# Patient Record
Sex: Female | Born: 1955 | Race: White | Hispanic: No | Smoking: Current every day smoker
Health system: Southern US, Community
[De-identification: ages and names within clinical notes are randomized; demographics above are authoritative.]

## PROBLEM LIST (undated history)

## (undated) DIAGNOSIS — G459 Transient cerebral ischemic attack, unspecified: Secondary | ICD-10-CM

## (undated) DIAGNOSIS — I10 Essential (primary) hypertension: Secondary | ICD-10-CM

## (undated) DIAGNOSIS — J45909 Unspecified asthma, uncomplicated: Secondary | ICD-10-CM

## (undated) HISTORY — PX: ABDOMINAL HYSTERECTOMY: SHX81

---

## 2016-08-26 ENCOUNTER — Encounter: Payer: Self-pay | Admitting: Emergency Medicine

## 2016-08-26 ENCOUNTER — Inpatient Hospital Stay
Admission: EM | Admit: 2016-08-26 | Discharge: 2016-08-27 | DRG: 190 | Disposition: A | Discharge status: Home / Self Care | Attending: Internal Medicine | Admitting: Internal Medicine

## 2016-08-26 ENCOUNTER — Emergency Department

## 2016-08-26 DIAGNOSIS — Z7982 Long term (current) use of aspirin: Secondary | ICD-10-CM

## 2016-08-26 DIAGNOSIS — F17200 Nicotine dependence, unspecified, uncomplicated: Secondary | ICD-10-CM | POA: Diagnosis present

## 2016-08-26 DIAGNOSIS — J441 Chronic obstructive pulmonary disease with (acute) exacerbation: Principal | ICD-10-CM | POA: Diagnosis present

## 2016-08-26 DIAGNOSIS — Z79899 Other long term (current) drug therapy: Secondary | ICD-10-CM

## 2016-08-26 DIAGNOSIS — Z8673 Personal history of transient ischemic attack (TIA), and cerebral infarction without residual deficits: Secondary | ICD-10-CM

## 2016-08-26 DIAGNOSIS — Z8249 Family history of ischemic heart disease and other diseases of the circulatory system: Secondary | ICD-10-CM | POA: Diagnosis not present

## 2016-08-26 DIAGNOSIS — J9601 Acute respiratory failure with hypoxia: Secondary | ICD-10-CM | POA: Diagnosis present

## 2016-08-26 DIAGNOSIS — R0602 Shortness of breath: Secondary | ICD-10-CM | POA: Diagnosis present

## 2016-08-26 DIAGNOSIS — I10 Essential (primary) hypertension: Secondary | ICD-10-CM | POA: Diagnosis present

## 2016-08-26 HISTORY — DX: Essential (primary) hypertension: I10

## 2016-08-26 HISTORY — DX: Unspecified asthma, uncomplicated: J45.909

## 2016-08-26 HISTORY — DX: Transient cerebral ischemic attack, unspecified: G45.9

## 2016-08-26 LAB — CBC
HEMATOCRIT: 47.2 % — AB (ref 35.0–47.0)
HEMOGLOBIN: 16.7 g/dL — AB (ref 12.0–16.0)
MCH: 31.5 pg (ref 26.0–34.0)
MCHC: 35.4 g/dL (ref 32.0–36.0)
MCV: 89 fL (ref 80.0–100.0)
Platelets: 323 10*3/uL (ref 150–440)
RBC: 5.3 MIL/uL — AB (ref 3.80–5.20)
RDW: 13.3 % (ref 11.5–14.5)
WBC: 11.7 10*3/uL — ABNORMAL HIGH (ref 3.6–11.0)

## 2016-08-26 LAB — BASIC METABOLIC PANEL
ANION GAP: 6 (ref 5–15)
BUN: 13 mg/dL (ref 6–20)
CALCIUM: 9.5 mg/dL (ref 8.9–10.3)
CO2: 37 mmol/L — ABNORMAL HIGH (ref 22–32)
Chloride: 93 mmol/L — ABNORMAL LOW (ref 101–111)
Creatinine, Ser: 1.01 mg/dL — ABNORMAL HIGH (ref 0.44–1.00)
GFR, EST NON AFRICAN AMERICAN: 59 mL/min — AB (ref >60)
GLUCOSE: 154 mg/dL — AB (ref 65–99)
POTASSIUM: 3.5 mmol/L (ref 3.5–5.1)
Sodium: 136 mmol/L (ref 135–145)

## 2016-08-26 LAB — TROPONIN I

## 2016-08-26 MED ORDER — METHYLPREDNISOLONE SODIUM SUCC 125 MG IJ SOLR
125.0000 mg | Freq: Four times a day (QID) | INTRAMUSCULAR | Status: DC
  Administered 2016-08-27 (×3): 125 mg via INTRAVENOUS
  Filled 2016-08-26 (×3): qty 2

## 2016-08-26 MED ORDER — IPRATROPIUM-ALBUTEROL 0.5-2.5 (3) MG/3ML IN SOLN
3.0000 mL | Freq: Once | RESPIRATORY_TRACT | Status: AC
  Administered 2016-08-26: 3 mL via RESPIRATORY_TRACT
  Filled 2016-08-26: qty 3

## 2016-08-26 MED ORDER — IPRATROPIUM-ALBUTEROL 0.5-2.5 (3) MG/3ML IN SOLN
3.0000 mL | Freq: Four times a day (QID) | RESPIRATORY_TRACT | Status: DC
  Administered 2016-08-27 (×2): 3 mL via RESPIRATORY_TRACT
  Filled 2016-08-26 (×3): qty 3

## 2016-08-26 MED ORDER — SODIUM CHLORIDE 0.9 % IV BOLUS (SEPSIS)
500.0000 mL | Freq: Once | INTRAVENOUS | Status: AC
  Administered 2016-08-26: 500 mL via INTRAVENOUS

## 2016-08-26 MED ORDER — LEVOFLOXACIN IN D5W 750 MG/150ML IV SOLN
750.0000 mg | Freq: Once | INTRAVENOUS | Status: AC
  Administered 2016-08-27: 750 mg via INTRAVENOUS
  Filled 2016-08-26: qty 150

## 2016-08-26 MED ORDER — ALBUTEROL SULFATE (2.5 MG/3ML) 0.083% IN NEBU
10.0000 mg | INHALATION_SOLUTION | Freq: Once | RESPIRATORY_TRACT | Status: AC
  Administered 2016-08-26: 10 mg via RESPIRATORY_TRACT

## 2016-08-26 MED ORDER — ALBUTEROL SULFATE (2.5 MG/3ML) 0.083% IN NEBU
INHALATION_SOLUTION | RESPIRATORY_TRACT | Status: AC
  Administered 2016-08-26: 10 mg via RESPIRATORY_TRACT
  Filled 2016-08-26: qty 12

## 2016-08-26 MED ORDER — DOXYCYCLINE HYCLATE 100 MG PO TABS
100.0000 mg | ORAL_TABLET | Freq: Once | ORAL | Status: AC
  Administered 2016-08-26: 100 mg via ORAL
  Filled 2016-08-26: qty 1

## 2016-08-26 MED ORDER — METHYLPREDNISOLONE SODIUM SUCC 125 MG IJ SOLR
125.0000 mg | Freq: Once | INTRAMUSCULAR | Status: AC
  Administered 2016-08-26: 125 mg via INTRAVENOUS
  Filled 2016-08-26: qty 2

## 2016-08-26 NOTE — ED Notes (Signed)
Pt ambulated around ed - denies sob but sp02 dropped to 89% and hr 100

## 2016-08-26 NOTE — ED Triage Notes (Addendum)
[facility] given [facility] tx. Pt reports still having SHOB. Denies fevers. Yellow productive cough. Worse on exertion. No COPD per pt, no home O2

## 2016-08-26 NOTE — ED Notes (Signed)
Pt states was seen last week at er after having (cold symptoms) x 1 week. States they offered to put her on preventative antibiotic and she declined. States not feeling better.

## 2016-08-26 NOTE — Progress Notes (Signed)
Pharmacy Antibiotic Note  Cipriano BunkerDeborah Fifield is a 60 y.o. female admitted on 08/26/2016 with AECOPD.  Pharmacy has been consulted for levofloxacin dosing.  Plan: Levofloxacin 750 mg IV Q48H  Height: 5\' 2"  (157.5 cm) Weight: 126 lb (57.2 kg) IBW/kg (Calculated) : 50.1  Temp (24hrs), Avg:98 F (36.7 C), Min:98 F (36.7 C), Max:98 F (36.7 C)   Recent Labs Lab 08/26/16 1733  WBC 11.7*  CREATININE 1.01*    Estimated Creatinine Clearance: 46.3 mL/min (by C-G formula based on SCr of 1.01 mg/dL (H)).    Allergies  Allergen Reactions  . Biaxin [Clarithromycin] Anaphylaxis  . Citracal [Calcium Citrate] Anaphylaxis  . Penicillins Rash and Other (See Comments)    Syncope Has patient had a PCN reaction causing immediate rash, facial/tongue/throat swelling, SOB or lightheadedness with hypotension: No Has patient had a PCN reaction causing severe rash involving mucus membranes or skin necrosis: No Has patient had a PCN reaction that required hospitalization No Has patient had a PCN reaction occurring within the last 10 years: No If all of the above answers are "NO", then may proceed with Cephalosporin use.   Doylene Bode. Septra [Sulfamethoxazole-Trimethoprim] Anaphylaxis  . Sudafed [Pseudoephedrine Hcl] Anaphylaxis  . Other     Pt reports allergy to one other abx but does not remember what    Thank you for allowing pharmacy to be a part of this patient's care.  Carola FrostNathan A Michelena Culmer, Pharm.D., BCPS Clinical Pharmacist 08/26/2016 11:49 PM

## 2016-08-26 NOTE — H&P (Signed)
[facility] -  at [facility]   PATIENT NAME: Gloria Beck    MR#:  [id number]  DATE OF BIRTH:  06/15/1956  DATE OF ADMISSION:  08/26/2016  PRIMARY CARE PHYSICIAN: No primary care provider on file.   REQUESTING/REFERRING PHYSICIAN:   CHIEF COMPLAINT:   Chief Complaint  Patient presents with  . Shortness of Breath    HISTORY OF PRESENT ILLNESS: Gloria Beck  is a 60 y.o. female with a known history of COPD, hypertension, transient ischemic attack presented to the emergency room with increased shortness of breath since one week. Patient moved from [place] to [place] week ago. Patient was seen at [facility] for similar complaints. She has shortness of breath and wheeze for the last 1 week and also has cough which is dry in nature. No history of any fever or chills. Chest x-ray showed COPD changes during the workup but no evidence of pneumonia. Patient was given IV Solu-Medrol and breathing treatments in the emergency room and hospitalist service was consulted for further care. No complaints of any chest pain. No history of fever or chills.  PAST MEDICAL HISTORY:   Past Medical History:  Diagnosis Date  . Asthma   . Hypertension   . TIA (transient ischemic attack)     PAST SURGICAL HISTORY: Past Surgical History:  Procedure Laterality Date  . ABDOMINAL HYSTERECTOMY      SOCIAL HISTORY:  Social History  Substance Use Topics  . Smoking status: Current Every Day Smoker  . Smokeless tobacco: Never Used  . Alcohol use No    FAMILY HISTORY:  Family History  Problem Relation Age of Onset  . Hypertension Mother     DRUG ALLERGIES:  Allergies  Allergen Reactions  . Biaxin [Clarithromycin] Anaphylaxis  . Citracal [Calcium Citrate] Anaphylaxis  . Penicillins Rash and Other (See Comments)    Syncope Has patient had a PCN reaction causing immediate rash, facial/tongue/throat swelling, SOB or lightheadedness with  hypotension: No Has patient had a PCN reaction causing severe rash involving mucus membranes or skin necrosis: No Has patient had a PCN reaction that required hospitalization No Has patient had a PCN reaction occurring within the last 10 years: No If all of the above answers are "NO", then may proceed with Cephalosporin use.   Doylene Bode [Sulfamethoxazole-Trimethoprim] Anaphylaxis  . Sudafed [Pseudoephedrine Hcl] Anaphylaxis  . Other     Pt reports allergy to one other abx but does not remember what    REVIEW OF SYSTEMS:   CONSTITUTIONAL: No fever, fatigue or weakness.  EYES: No blurred or double vision.  EARS, NOSE, AND THROAT: No tinnitus or ear pain.  RESPIRATORY: Has cough, shortness of breath, wheezing  no hemoptysis.  CARDIOVASCULAR: No chest pain, orthopnea, edema.  GASTROINTESTINAL: No nausea, vomiting, diarrhea or abdominal pain.  GENITOURINARY: No dysuria, hematuria.  ENDOCRINE: No polyuria, nocturia,  HEMATOLOGY: No anemia, easy bruising or bleeding SKIN: No rash or lesion. MUSCULOSKELETAL: No joint pain or arthritis.   NEUROLOGIC: No tingling, numbness, weakness.  PSYCHIATRY: No anxiety or depression.   MEDICATIONS AT HOME:  Prior to Admission medications   Medication Sig Start Date End Date Taking? Authorizing Provider  albuterol (PROVENTIL HFA;VENTOLIN HFA) 108 (90 Base) MCG/ACT inhaler Inhale 2 puffs into the lungs every 6 (six) hours as needed for wheezing or shortness of breath.  06/25/16 06/25/17 Yes Historical Provider, MD  aspirin EC 81 MG tablet Take 81 mg by mouth at bedtime.   Yes Historical Provider, MD  busPIRone (BUSPAR) 5 MG tablet Take 5 mg by mouth 2 (two) times daily. 02/22/16  Yes Historical Provider, MD  carvedilol (COREG) 25 MG tablet Take 25 mg by mouth 2 (two) times daily. 02/22/16  Yes Historical Provider, MD  clonazePAM (KLONOPIN) 1 MG tablet Take 2 mg by mouth 2 (two) times daily. 08/09/16  Yes Historical Provider, MD  DULoxetine (CYMBALTA) 60 MG  capsule Take 1 capsule by mouth daily. 07/03/16  Yes Historical Provider, MD  Fluticasone-Salmeterol (ADVAIR DISKUS) 100-50 MCG/DOSE AEPB Inhale 1 puff into the lungs 2 (two) times daily. 07/03/16  Yes Historical Provider, MD  omeprazole (PRILOSEC) 20 MG capsule Take 20 mg by mouth daily. 02/22/16  Yes Historical Provider, MD  potassium chloride SA (K-DUR,KLOR-CON) 20 MEQ tablet Take 20 mEq by mouth 2 (two) times daily. 07/03/16 07/03/17 Yes Historical Provider, MD  tiotropium (SPIRIVA) 18 MCG inhalation capsule Place 1 capsule into inhaler and inhale daily. 07/03/16  Yes Historical Provider, MD  triamterene-hydrochlorothiazide (DYAZIDE) 37.5-25 MG capsule Take 1 capsule by mouth daily. 07/03/16  Yes Historical Provider, MD      PHYSICAL EXAMINATION:   VITAL SIGNS: Blood pressure (!) 160/95, pulse (!) 104, temperature 98 F (36.7 C), temperature source Oral, resp. rate 18, height 5\' 2"  (1.575 m), weight 57.2 kg (126 lb), SpO2 (!) 87 %.  GENERAL:  60 y.o.-year-old patient lying in the bed with no acute distress.  EYES: Pupils equal, round, reactive to light and accommodation. No scleral icterus. Extraocular muscles intact.  HEENT: Head atraumatic, normocephalic. Oropharynx and nasopharynx clear.  NECK:  Supple, no jugular venous distention. No thyroid enlargement, no tenderness.  LUNGS: Decreased breath sounds bilaterally, has wheezing in both lungs, rales. No use of accessory muscles of respiration.  CARDIOVASCULAR: S1, S2 normal. No murmurs, rubs, or gallops.  ABDOMEN: Soft, nontender, nondistended. Bowel sounds present. No organomegaly or mass.  EXTREMITIES: No pedal edema, cyanosis, or clubbing.  NEUROLOGIC: Cranial nerves II through XII are intact. Muscle strength 5/5 in all extremities. Sensation intact. Gait not checked.  PSYCHIATRIC: The patient is alert and oriented x 3.  SKIN: No obvious rash, lesion, or ulcer.   LABORATORY PANEL:   CBC  Recent Labs Lab 08/26/16 1733  WBC 11.7*   HGB 16.7*  HCT 47.2*  PLT 323  MCV 89.0  MCH 31.5  MCHC 35.4  RDW 13.3   ------------------------------------------------------------------------------------------------------------------  Chemistries   Recent Labs Lab 08/26/16 1733  NA 136  K 3.5  CL 93*  CO2 37*  GLUCOSE 154*  BUN 13  CREATININE 1.01*  CALCIUM 9.5   ------------------------------------------------------------------------------------------------------------------ estimated creatinine clearance is 46.3 mL/min (by C-G formula based on SCr of 1.01 mg/dL (H)). ------------------------------------------------------------------------------------------------------------------ No results for input(s): TSH, T4TOTAL, T3FREE, THYROIDAB in the last 72 hours.  Invalid input(s): FREET3   Coagulation profile No results for input(s): INR, PROTIME in the last 168 hours. ------------------------------------------------------------------------------------------------------------------- No results for input(s): DDIMER in the last 72 hours. -------------------------------------------------------------------------------------------------------------------  Cardiac Enzymes  Recent Labs Lab 08/26/16 1733 08/26/16 2013  TROPONINI <0.03 <0.03   ------------------------------------------------------------------------------------------------------------------ Invalid input(s): POCBNP  ---------------------------------------------------------------------------------------------------------------  Urinalysis No results found for: COLORURINE, APPEARANCEUR, LABSPEC, PHURINE, GLUCOSEU, HGBUR, BILIRUBINUR, KETONESUR, PROTEINUR, UROBILINOGEN, NITRITE, LEUKOCYTESUR   RADIOLOGY: Dg Chest 2 View  Result Date: 08/26/2016 CLINICAL DATA:  Shortness of breath for 2 weeks. EXAM: CHEST  2 VIEW COMPARISON:  None. FINDINGS: The cardiomediastinal silhouette is unremarkable. COPD/emphysema changes noted. Bibasilar atelectasis/ scarring  noted. There is no evidence of focal airspace disease, pulmonary edema, suspicious pulmonary nodule/mass,  pleural effusion, or pneumothorax. No acute bony abnormalities are identified. IMPRESSION: COPD/ emphysema with mild bibasilar atelectasis versus scarring. Electronically Signed   By: Harmon PierJeffrey  Hu M.D.   On: 08/26/2016 17:58    EKG: Orders placed or performed during the hospital encounter of 08/26/16  . EKG 12-Lead  . EKG 12-Lead  . ED EKG  . ED EKG  . ED EKG  . ED EKG  . EKG 12-Lead  . EKG 12-Lead    IMPRESSION AND PLAN: 60 year old female patient with history of COPD, TIA and hypertension presented to the emergency room with increased shortness of breath and cough. Admitting diagnosis 1. Acute COPD exacerbation 2. Dyspnea secondary to COPD exacerbation 3. Hypertension Treatment plan Admit patient to medical floor IV Solu-Medrol 125 MG every 6 hourly Breathing treatments around the clock Start patient on IV Levaquin antibiotic DVT prophylaxis subcutaneous Lovenox 40 MG daily Supportive care Smoking cessation counseled to the patient.  All the records are reviewed and case discussed with ED provider. Management plans discussed with the patient, family and they are in agreement.  CODE STATUS:FULL Code Status History    This patient does not have a recorded code status. Please follow your organizational policy for patients in this situation.       TOTAL TIME TAKING CARE OF THIS PATIENT: 50 minutes.    Ihor AustinPavan Amanie Mcculley M.D on 08/26/2016 at 11:42 PM  Between 7am to 6pm - Pager - [phone]  After 6pm go to [email or web address] - password [email or web address] [facility]  [facility]  Office  [phone]  CC: Primary care physician; No primary care provider on file.

## 2016-08-26 NOTE — ED Provider Notes (Addendum)
[facility] Emergency Department Provider Note  ____________________________________________  Time seen: Approximately 6:22 PM  I have reviewed the triage vital signs and the nursing notes.   HISTORY  Chief Complaint Shortness of Breath   HPI Gloria Beck is a 60 y.o. female with a history of asthma, COPD, current smoker, hypertension, and TIA who presents for evaluation of shortness of breath. Patient reports 3 weeks of wheezing, cough productive of light yellow sputum and shortness of breath that is worse with exertion. She was seen at [facility] a week ago and received a 1-1 and treatment with improvement of her symptoms. Patient was not started on antibiotics or steroids. She reports that she felt better for a few days but then it got progressively worse. Her inhalers are not no longer helping at home. Patient reports that she is on Spiriva and Advair at home. Patient denies chest pain, orthopnea, leg pain or swelling, personal or family history of ischemic heart disease, blood clots, recent travel or immobilization, history of exogenous hormones or hemoptysis. Patient is not on oxygen at home. No fever, no chills, no nausea, no vomiting.  Past Medical History:  Diagnosis Date  . Asthma   . Hypertension   . TIA (transient ischemic attack)     There are no active problems to display for this patient.   Past Surgical History:  Procedure Laterality Date  . ABDOMINAL HYSTERECTOMY      Prior to Admission medications   Not on File    Allergies Biaxin [clarithromycin]; Citracal [calcium citrate]; Septra [sulfamethoxazole-trimethoprim]; Sudafed [pseudoephedrine hcl]; Other; and Penicillins  History reviewed. No pertinent family history.  Social History Social History  Substance Use Topics  . Smoking status: Current Every Day Smoker  . Smokeless tobacco: Not on file  . Alcohol use No    Review of Systems  Constitutional: Negative for fever. Eyes:  Negative for visual changes. ENT: Negative for sore throat. Cardiovascular: Negative for chest pain. Respiratory: + shortness of breath, wheezing and cough Gastrointestinal: Negative for abdominal pain, vomiting or diarrhea. Genitourinary: Negative for dysuria. Musculoskeletal: Negative for back pain. Skin: Negative for rash. Neurological: Negative for headaches, weakness or numbness.  ____________________________________________   PHYSICAL EXAM:  VITAL SIGNS: ED Triage Vitals  Enc Vitals Group     BP 08/26/16 1734 (!) 127/98     Pulse Rate 08/26/16 1734 (!) 108     Resp 08/26/16 1734 (!) 26     Temp 08/26/16 1734 98 F (36.7 C)     Temp Source 08/26/16 1734 Oral     SpO2 08/26/16 1734 (!) 88 %     Weight 08/26/16 1729 126 lb (57.2 kg)     Height 08/26/16 1729 5\' 2"  (1.575 m)     Head Circumference --      Peak Flow --      Pain Score --      Pain Loc --      Pain Edu? --      Excl. in GC? --     Constitutional: Alert and oriented. Well appearing and in no apparent distress. HEENT:      Head: Normocephalic and atraumatic.         Eyes: Conjunctivae are normal. Sclera is non-icteric. EOMI. PERRL      Mouth/Throat: Mucous membranes are moist.       Neck: Supple with no signs of meningismus. Cardiovascular: Regular rate and rhythm. No murmurs, gallops, or rubs. 2+ symmetrical distal pulses are present in all  extremities. No JVD. Respiratory: Tachypnea with mildly increased work of breathing. Sating 93% on RA, severely diminished air movement bilaterally with no crackles or wheezes  Gastrointestinal: Soft, non tender, and non distended with positive bowel sounds. No rebound or guarding. Musculoskeletal: Nontender with normal range of motion in all extremities. No edema, cyanosis, or erythema of extremities. Neurologic: Normal speech and language. Face is symmetric. Moving all extremities. No gross focal neurologic deficits are appreciated. Skin: Skin is warm, dry and intact.  No rash noted. Psychiatric: Mood and affect are normal. Speech and behavior are normal.  ____________________________________________   LABS (all labs ordered are listed, but only abnormal results are displayed)  Labs Reviewed  BASIC METABOLIC PANEL - Abnormal; Notable for the following:       Result Value   Chloride 93 (*)    CO2 37 (*)    Glucose, Bld 154 (*)    Creatinine, Ser 1.01 (*)    GFR calc non Af Amer 59 (*)    All other components within normal limits  CBC - Abnormal; Notable for the following:    WBC 11.7 (*)    RBC 5.30 (*)    Hemoglobin 16.7 (*)    HCT 47.2 (*)    All other components within normal limits  TROPONIN I  TROPONIN I   ____________________________________________  EKG  ED ECG REPORT I, Nita Sicklearolina Annastyn Silvey, the attending physician, personally viewed and interpreted this ECG.  Sinus tachycardia, rate of 110, normal intervals, normal axis, slight ST depressions on inferior leads, no STE, no old for comparison.  21:59 - Normal sinus rhythm, rate of 88, normal intervals, normal axis, slight ST depressions in inferior leads which are unchanged from prior. No ST elevations. ____________________________________________  RADIOLOGY  CXR:  COPD/ emphysema with mild bibasilar atelectasis versus scarring ____________________________________________   PROCEDURES  Procedure(s) performed: None Procedures Critical Care performed: yes  CRITICAL CARE Performed by: Nita Sicklearolina Kathaleen Dudziak  ?  Total critical care time: 35 min  Critical care time was exclusive of separately billable procedures and treating other patients.  Critical care was necessary to treat or prevent imminent or life-threatening deterioration.  Critical care was time spent personally by me on the following activities: development of treatment plan with patient and/or surrogate as well as nursing, discussions with consultants, evaluation of patient's response to treatment, examination of  patient, obtaining history from patient or surrogate, ordering and performing treatments and interventions, ordering and review of laboratory studies, ordering and review of radiographic studies, pulse oximetry and re-evaluation of patient's condition. 35 ____________________________________________   INITIAL IMPRESSION / ASSESSMENT AND PLAN / ED COURSE  60 y.o. female with a history of asthma, COPD, current smoker, hypertension, and TIA who presents for evaluation of shortness of breath, wheezing and cough productive of yellow sputum. Patient was found to be hypoxic on triage and placed on 2 L nasal cannula. During my examination I turned off the oxygen and patient was satting 93%. She has slightly increased work of breathing, severely diminished air movement bilaterally concerning for COPD/asthma exacerbation. Will start patient on DuoNeb 3, Solu-Medrol, and doxycycline (patient has anaphylaxis to azithromycin). EKG has slight ST depressions on inferior leads with no ST elevation. No old for comparison. The first troponin is negative. Possibly a slight component of demand ischemia in the setting of tachycardia. We'll recheck a second troponin and EKG in 3 hours. Patient has no chest pain.  Clinical Course  Comment By Time  Patient received a DuoNeb 3 and has faint  expiratory wheezes but continues to have really decreased air movement. I attempted to ambulate patient and she decided to 86%. Patient wishes to go home therefore we'll give her 10 mg of albuterol and reassess at that time. Nita Sickle, MD 10/15 2012  Patient received 10 mg of albuterol and continued to drop her sats to the upper 80s with minimal ambulation in the ED. We'll consult the hospitalist for admission. Nita Sickle, MD 10/15 2124    Pertinent labs & imaging results that were available during my care of the patient were reviewed by me and considered in my medical decision making (see chart for  details).    ____________________________________________   FINAL CLINICAL IMPRESSION(S) / ED DIAGNOSES  Final diagnoses:  Acute respiratory failure with hypoxia (HCC)  COPD exacerbation (HCC)      NEW MEDICATIONS STARTED DURING THIS VISIT:  New Prescriptions   No medications on file     Note:  This document was prepared using Dragon voice recognition software and may include unintentional dictation errors.    Nita Sickle, MD 08/26/16 2126    Nita Sickle, MD 08/26/16 2202

## 2016-08-27 LAB — BASIC METABOLIC PANEL
ANION GAP: 7 (ref 5–15)
BUN: 13 mg/dL (ref 6–20)
CHLORIDE: 97 mmol/L — AB (ref 101–111)
CO2: 31 mmol/L (ref 22–32)
Calcium: 9.2 mg/dL (ref 8.9–10.3)
Creatinine, Ser: 0.83 mg/dL (ref 0.44–1.00)
GFR calc non Af Amer: >60 mL/min (ref >60)
Glucose, Bld: 166 mg/dL — ABNORMAL HIGH (ref 65–99)
POTASSIUM: 3.1 mmol/L — AB (ref 3.5–5.1)
SODIUM: 135 mmol/L (ref 135–145)

## 2016-08-27 LAB — CBC
HEMATOCRIT: 43.7 % (ref 35.0–47.0)
HEMOGLOBIN: 15.1 g/dL (ref 12.0–16.0)
MCH: 31.1 pg (ref 26.0–34.0)
MCHC: 34.5 g/dL (ref 32.0–36.0)
MCV: 90.1 fL (ref 80.0–100.0)
PLATELETS: 277 10*3/uL (ref 150–440)
RBC: 4.85 MIL/uL (ref 3.80–5.20)
RDW: 13.4 % (ref 11.5–14.5)
WBC: 6.6 10*3/uL (ref 3.6–11.0)

## 2016-08-27 LAB — PROCALCITONIN: Procalcitonin: <0.1 ng/mL

## 2016-08-27 MED ORDER — DULOXETINE HCL 60 MG PO CPEP
60.0000 mg | ORAL_CAPSULE | Freq: Every day | ORAL | Status: DC
  Administered 2016-08-27: 60 mg via ORAL
  Filled 2016-08-27: qty 1

## 2016-08-27 MED ORDER — PANTOPRAZOLE SODIUM 40 MG PO TBEC
40.0000 mg | DELAYED_RELEASE_TABLET | Freq: Every day | ORAL | Status: DC
  Administered 2016-08-27: 40 mg via ORAL
  Filled 2016-08-27: qty 1

## 2016-08-27 MED ORDER — HYDROCODONE-ACETAMINOPHEN 5-325 MG PO TABS
1.0000 | ORAL_TABLET | ORAL | Status: DC | PRN
Start: 2016-08-27 — End: 2016-08-27

## 2016-08-27 MED ORDER — ACETAMINOPHEN 650 MG RE SUPP: 650.0000 mg | Freq: Four times a day (QID) | RECTAL | Status: DC | PRN

## 2016-08-27 MED ORDER — TRIAMTERENE-HCTZ 37.5-25 MG PO TABS
1.0000 | ORAL_TABLET | Freq: Every day | ORAL | Status: DC
  Administered 2016-08-27: 1 via ORAL
  Filled 2016-08-27 (×2): qty 1

## 2016-08-27 MED ORDER — LEVOFLOXACIN IN D5W 750 MG/150ML IV SOLN: 750.0000 mg | INTRAVENOUS | Status: DC

## 2016-08-27 MED ORDER — SODIUM CHLORIDE 0.9% FLUSH
3.0000 mL | Freq: Two times a day (BID) | INTRAVENOUS | Status: DC
  Administered 2016-08-27 (×2): 3 mL via INTRAVENOUS

## 2016-08-27 MED ORDER — LEVOFLOXACIN 750 MG PO TABS: 750.0000 mg | ORAL_TABLET | Freq: Every day | ORAL | 0 refills | Status: DC

## 2016-08-27 MED ORDER — ACETAMINOPHEN 325 MG PO TABS: 650.0000 mg | ORAL_TABLET | Freq: Four times a day (QID) | ORAL | Status: DC | PRN

## 2016-08-27 MED ORDER — ONDANSETRON HCL 4 MG PO TABS: 4.0000 mg | ORAL_TABLET | Freq: Four times a day (QID) | ORAL | Status: DC | PRN

## 2016-08-27 MED ORDER — SODIUM CHLORIDE 0.9% FLUSH: 3.0000 mL | INTRAVENOUS | Status: DC | PRN

## 2016-08-27 MED ORDER — PREDNISONE 50 MG PO TABS: 50.0000 mg | ORAL_TABLET | Freq: Every day | ORAL | Status: DC

## 2016-08-27 MED ORDER — TIOTROPIUM BROMIDE MONOHYDRATE 18 MCG IN CAPS
1.0000 | ORAL_CAPSULE | Freq: Every day | RESPIRATORY_TRACT | Status: DC
  Administered 2016-08-27: 09:00:00 18 ug via RESPIRATORY_TRACT
  Filled 2016-08-27: qty 5

## 2016-08-27 MED ORDER — PREDNISONE 50 MG PO TABS: ORAL_TABLET | ORAL | 0 refills | Status: AC

## 2016-08-27 MED ORDER — POTASSIUM CHLORIDE CRYS ER 20 MEQ PO TBCR
20.0000 meq | EXTENDED_RELEASE_TABLET | Freq: Two times a day (BID) | ORAL | Status: DC
  Administered 2016-08-27 (×2): 20 meq via ORAL
  Filled 2016-08-27 (×2): qty 1

## 2016-08-27 MED ORDER — BUSPIRONE HCL 5 MG PO TABS
5.0000 mg | ORAL_TABLET | Freq: Two times a day (BID) | ORAL | Status: DC
  Administered 2016-08-27 (×2): 5 mg via ORAL
  Filled 2016-08-27 (×2): qty 1

## 2016-08-27 MED ORDER — ASPIRIN EC 81 MG PO TBEC
81.0000 mg | DELAYED_RELEASE_TABLET | Freq: Every day | ORAL | Status: DC
  Administered 2016-08-27: 02:00:00 81 mg via ORAL
  Filled 2016-08-27: qty 1

## 2016-08-27 MED ORDER — SODIUM CHLORIDE 0.9 % IV SOLN: 250.0000 mL | INTRAVENOUS | Status: DC | PRN

## 2016-08-27 MED ORDER — ONDANSETRON HCL 4 MG/2ML IJ SOLN: 4.0000 mg | Freq: Four times a day (QID) | INTRAMUSCULAR | Status: DC | PRN

## 2016-08-27 MED ORDER — IPRATROPIUM-ALBUTEROL 0.5-2.5 (3) MG/3ML IN SOLN: 3.0000 mL | Freq: Four times a day (QID) | RESPIRATORY_TRACT | 1 refills | Status: AC

## 2016-08-27 MED ORDER — LEVOFLOXACIN 750 MG PO TABS: 750.0000 mg | ORAL_TABLET | Freq: Every day | ORAL | Status: DC

## 2016-08-27 MED ORDER — CLONAZEPAM 1 MG PO TABS
2.0000 mg | ORAL_TABLET | Freq: Two times a day (BID) | ORAL | Status: DC
  Administered 2016-08-27 (×2): 2 mg via ORAL
  Filled 2016-08-27 (×2): qty 2

## 2016-08-27 MED ORDER — CARVEDILOL 25 MG PO TABS
25.0000 mg | ORAL_TABLET | Freq: Two times a day (BID) | ORAL | Status: DC
  Administered 2016-08-27: 25 mg via ORAL
  Filled 2016-08-27: qty 1

## 2016-08-27 MED ORDER — ENOXAPARIN SODIUM 40 MG/0.4ML ~~LOC~~ SOLN
40.0000 mg | Freq: Every day | SUBCUTANEOUS | Status: DC
  Administered 2016-08-27: 02:00:00 40 mg via SUBCUTANEOUS
  Filled 2016-08-27: qty 0.4

## 2016-08-27 MED ORDER — SENNOSIDES-DOCUSATE SODIUM 8.6-50 MG PO TABS: 1.0000 | ORAL_TABLET | Freq: Every evening | ORAL | Status: DC | PRN

## 2016-08-27 NOTE — Progress Notes (Signed)
Assumed care of patient from Skyler Stone, RN at 1010. Tareka Jhaveri S, RN  

## 2016-08-27 NOTE — Discharge Instructions (Signed)
Chronic Obstructive Pulmonary Disease Chronic obstructive pulmonary disease (COPD) is a common lung condition in which airflow from the lungs is limited. COPD is a general term that can be used to describe many different lung problems that limit airflow, including both chronic bronchitis and emphysema. If you have COPD, your lung function will probably never return to normal, but there are measures you can take to improve lung function and make yourself feel better. CAUSES   Smoking (common).  Exposure to secondhand smoke.  Genetic problems.  Chronic inflammatory lung diseases or recurrent infections. SYMPTOMS  Shortness of breath, especially with physical activity.  Deep, persistent (chronic) cough with a large amount of thick mucus.  Wheezing.  Rapid breaths (tachypnea).  Gray or bluish discoloration (cyanosis) of the skin, especially in your fingers, toes, or lips.  Fatigue.  Weight loss.  Frequent infections or episodes when breathing symptoms become much worse (exacerbations).  Chest tightness. DIAGNOSIS Your health care provider will take a medical history and perform a physical examination to diagnose COPD. Additional tests for COPD may include:  Lung (pulmonary) function tests.  Chest X-ray.  CT scan.  Blood tests. TREATMENT  Treatment for COPD may include:  Inhaler and nebulizer medicines. These help manage the symptoms of COPD and make your breathing more comfortable.  Supplemental oxygen. Supplemental oxygen is only helpful if you have a low oxygen level in your blood.  Exercise and physical activity. These are beneficial for nearly all people with COPD.  Lung surgery or transplant.  Nutrition therapy to gain weight, if you are underweight.  Pulmonary rehabilitation. This may involve working with a team of health care providers and specialists, such as respiratory, occupational, and physical therapists. HOME CARE INSTRUCTIONS  Take all medicines  (inhaled or pills) as directed by your health care provider.  Avoid over-the-counter medicines or cough syrups that dry up your airway (such as antihistamines) and slow down the elimination of secretions unless instructed otherwise by your health care provider.  If you are a smoker, the most important thing that you can do is stop smoking. Continuing to smoke will cause further lung damage and breathing trouble. Ask your health care provider for help with quitting smoking. He or she can direct you to community resources or hospitals that provide support.  Avoid exposure to irritants such as smoke, chemicals, and fumes that aggravate your breathing.  Use oxygen therapy and pulmonary rehabilitation if directed by your health care provider. If you require home oxygen therapy, ask your health care provider whether you should purchase a pulse oximeter to measure your oxygen level at home.  Avoid contact with individuals who have a contagious illness.  Avoid extreme temperature and humidity changes.  Eat healthy foods. Eating smaller, more frequent meals and resting before meals may help you maintain your strength.  Stay active, but balance activity with periods of rest. Exercise and physical activity will help you maintain your ability to do things you want to do.  Preventing infection and hospitalization is very important when you have COPD. Make sure to receive all the vaccines your health care provider recommends, especially the pneumococcal and influenza vaccines. Ask your health care provider whether you need a pneumonia vaccine.  Learn and use relaxation techniques to manage stress.  Learn and use controlled breathing techniques as directed by your health care provider. Controlled breathing techniques include:  Pursed lip breathing. Start by breathing in (inhaling) through your nose for 1 second. Then, purse your lips as if you were  going to whistle and breathe out (exhale) through the  pursed lips for 2 seconds.  Diaphragmatic breathing. Start by putting one hand on your abdomen just above your waist. Inhale slowly through your nose. The hand on your abdomen should move out. Then purse your lips and exhale slowly. You should be able to feel the hand on your abdomen moving in as you exhale.  Learn and use controlled coughing to clear mucus from your lungs. Controlled coughing is a series of short, progressive coughs. The steps of controlled coughing are: 1. Lean your head slightly forward. 2. Breathe in deeply using diaphragmatic breathing. 3. Try to hold your breath for 3 seconds. 4. Keep your mouth slightly open while coughing twice. 5. Spit any mucus out into a tissue. 6. Rest and repeat the steps once or twice as needed. SEEK MEDICAL CARE IF:  You are coughing up more mucus than usual.  There is a change in the color or thickness of your mucus.  Your breathing is more labored than usual.  Your breathing is faster than usual. SEEK IMMEDIATE MEDICAL CARE IF:  You have shortness of breath while you are resting.  You have shortness of breath that prevents you from:  Being able to talk.  Performing your usual physical activities.  You have chest pain lasting longer than 5 minutes.  Your skin color is more cyanotic than usual.  You measure low oxygen saturations for longer than 5 minutes with a pulse oximeter. MAKE SURE YOU:  Understand these instructions.  Will watch your condition.  Will get help right away if you are not doing well or get worse.   This information is not intended to replace advice given to you by your health care provider. Make sure you discuss any questions you have with your health care provider.   Document Released: 08/08/2005 Document Revised: 11/19/2014 Document Reviewed: 06/25/2013 Elsevier Interactive Patient Education 2016 [employer]   Acute Respiratory Distress Syndrome Acute respiratory distress syndrome is a  life-threatening condition in which fluid collects in the lungs. This condition can cause severe shortness of breath and low oxygen levels in the blood. It can also cause the lungs and other vital organs to fail. The condition usually develops within 24 to 48 hours of an infection, illness, surgery, or injury. CAUSES This condition may be caused by:  An infection, such as sepsis or pneumonia.  A serious injury to the head or chest.  A major surgery.  A drug overdose.  Breathing in harmful chemicals or smoke.  Blood transfusions.  A blood clot in the lungs. SYMPTOMS Symptoms of this condition include:  Fever.  Difficulty breathing.  Bluish skin (cyanosis).  A fast or irregular heartbeat.  Low blood pressure (hypotension).  Agitation.  Confusion.  Lack of energy (lethargy).  Sweating. DIAGNOSIS This condition may be diagnosed by using tests to rule out other diseases and conditions that cause similar symptoms. You may have:  A chest X-ray or CT scan.  Blood tests.  A sputum culture. In this test, you will be asked to spit so that a sample of lung fluid can be taken for testing.  Bronchoscopy. During this test a thin, flexible tool is passed into the mouth or nose, down the windpipe, and into the lungs. TREATMENT This condition may be treated with:  Oxygen. A breathing machine (ventilator) is often used to provide oxygen and help with breathing.  Medicine to help you relax (sedative).  Fluids and nutrients given through an IV  tube.  Blood pressure medicine.  Steroid medicine to help decrease inflammation in the lungs.  Diuretic medicine to get rid of extra fluid in the body. Additional treatment may be needed, depending on the cause of the condition. It can take up to 12 months to recover from this condition. Some people recover fully, but others may continue to have:  Weakness.  Shortness of breath.  Memory problems.  Depression. HOME CARE  INSTRUCTIONS Until you recover from this condition:  Do not smoke.  Limit alcohol intake to no more than 1 drink per day for nonpregnant women and 2 drinks per day for men. One drink equals 12 oz of beer, 5 oz of wine, or 1 oz of hard liquor.  Ask friends and family to help you if daily activities make you tired. SEEK MEDICAL CARE IF:  You become short of breath with activity or while resting.  You develop a cough that does not go away.  You have a fever. SEEK IMMEDIATE MEDICAL CARE IF:  You have sudden shortness of breath.  You develop chest pain that does not go away.  You develop swelling or pain in one of your legs.  You cough up blood.   This information is not intended to replace advice given to you by your health care provider. Make sure you discuss any questions you have with your health care provider.   Your PCP in 1-2 weeks. Use your oxygen and nebulizer and oxygen as insturcted

## 2016-08-27 NOTE — Progress Notes (Signed)
Initial Nutrition Assessment  DOCUMENTATION CODES:   Not applicable  INTERVENTION:  -Encouraged intake of well balanced diet (ie lean protein, fruits and vegetables and whole grains). Discussed small frequent meals during times of shortness of breath. -Encouraged oral nutrition supplements if intake not adequate and continues with wt loss.  Pt verbalized understanding   NUTRITION DIAGNOSIS:   Inadequate oral intake related to social / environmental circumstances as evidenced by per patient/family report.    GOAL:   Patient will meet greater than or equal to 90% of their needs    MONITOR:   PO intake, Weight trends  REASON FOR ASSESSMENT:   Malnutrition Screening Tool    ASSESSMENT:      Pt admitted with COPD exacerbation, shortness of breath.  History of COPD, HTN, TIA.   Pt reports decreased intake over the last year due to divorce from husband and her living with different family members and friends over the past year.    Reports wt loss of 17  Pounds in the last year (12% wt loss in the last year)  Medications and labs reviewed:   Nutrition-Focused physical exam completed. Findings are WDL for fat depletion, muscle depletion, and edema.     Diet Order:  Diet 2 gram sodium Room service appropriate? Yes; Fluid consistency: Thin  Skin:  Reviewed, no issues  Last BM:  10/15  Height:   Ht Readings from Last 1 Encounters:  08/27/16 5\' 2"  (1.575 m)    Weight:   Wt Readings from Last 1 Encounters:  08/27/16 118 lb 14.4 oz (53.9 kg)    Ideal Body Weight:     BMI:  Body mass index is 21.75 kg/m.  Estimated Nutritional Needs:   Kcal:  1620-1800 kcals/d  Protein:  54 g/d  Fluid:  >/= 161400ml/d  EDUCATION NEEDS:   Education needs addressed  Brittany Amirault B. Freida BusmanAllen, RD, LDN [phone] (pager) Weekend/On-Call pager [phone])

## 2016-08-27 NOTE — Progress Notes (Signed)
SATURATION QUALIFICATIONS: (This note is used to comply with regulatory documentation for home oxygen)  Patient Saturations on Room Air at Rest = 92%  Patient Saturations on Room Air while Ambulating = 86%  Patient Saturations on 2 Liters of oxygen while Ambulating = 94%

## 2016-08-27 NOTE — Discharge Summary (Signed)
[facility] - [place] at [facility]   PATIENT NAME: Gloria Beck    MR#:  [id number]  DATE OF BIRTH:  February 14, 1956  DATE OF ADMISSION:  08/26/2016 ADMITTING PHYSICIAN: Ihor Austin, MD  DATE OF DISCHARGE: 08/27/16  PRIMARY CARE PHYSICIAN: No primary care provider on file.    ADMISSION DIAGNOSIS:  COPD exacerbation (HCC) [J44.1] Acute respiratory failure with hypoxia (HCC) [J96.01]  DISCHARGE DIAGNOSIS:  Acute on Chronic COPD flare Ongoing tobacco abuse  SECONDARY DIAGNOSIS:   Past Medical History:  Diagnosis Date  . Asthma   . Hypertension   . TIA (transient ischemic attack)     HOSPITAL COURSE:   60 year old female patient with history of COPD, TIA and hypertension presented to the emergency room with increased shortness of breath and cough. Admitting diagnosis 1. Acute COPD exacerbation IV Solu-Medrol 125 MG once and then change to po taper -Breathing treatments around the clock, inhalers and pt will need home oxygen set up  -procalcitonin is negative. D/c abxs. Clinically not indicated  2. Dyspnea secondary to COPD exacerbation -improved. No distress or wheezing  3. Hypertension Cont home meds  4. Tobacco abuse counselled cessation for 3 mins  Overall improving. Pt requesting to go home. Will arrange for home oxygen and nebulizer  D/c home CONSULTS OBTAINED:    DRUG ALLERGIES:   Allergies  Allergen Reactions  . Biaxin [Clarithromycin] Anaphylaxis  . Citracal [Calcium Citrate] Anaphylaxis  . Penicillins Rash and Other (See Comments)    Syncope Has patient had a PCN reaction causing immediate rash, facial/tongue/throat swelling, SOB or lightheadedness with hypotension: No Has patient had a PCN reaction causing severe rash involving mucus membranes or skin necrosis: No Has patient had a PCN reaction that required hospitalization No Has patient had a PCN reaction occurring within the last 10 years: No If all of the above  answers are "NO", then may proceed with Cephalosporin use.   Doylene Bode [Sulfamethoxazole-Trimethoprim] Anaphylaxis  . Sudafed [Pseudoephedrine Hcl] Anaphylaxis  . Other     Pt reports allergy to one other abx but does not remember what    DISCHARGE MEDICATIONS:   Current Discharge Medication List    START taking these medications   Details  ipratropium-albuterol (DUONEB) 0.5-2.5 (3) MG/3ML SOLN Take 3 mLs by nebulization every 6 (six) hours. Qty: 360 mL, Refills: 1    predniSONE (DELTASONE) 50 MG tablet Start with 50 mg qd Taper by 10 mg daily then stop Qty: 15 tablet, Refills: 0      CONTINUE these medications which have NOT CHANGED   Details  albuterol (PROVENTIL HFA;VENTOLIN HFA) 108 (90 Base) MCG/ACT inhaler Inhale 2 puffs into the lungs every 6 (six) hours as needed for wheezing or shortness of breath.     aspirin EC 81 MG tablet Take 81 mg by mouth at bedtime.    busPIRone (BUSPAR) 5 MG tablet Take 5 mg by mouth 2 (two) times daily.    carvedilol (COREG) 25 MG tablet Take 25 mg by mouth 2 (two) times daily.    clonazePAM (KLONOPIN) 1 MG tablet Take 2 mg by mouth 2 (two) times daily. Refills: 1    DULoxetine (CYMBALTA) 60 MG capsule Take 1 capsule by mouth daily.    Fluticasone-Salmeterol (ADVAIR DISKUS) 100-50 MCG/DOSE AEPB Inhale 1 puff into the lungs 2 (two) times daily.    omeprazole (PRILOSEC) 20 MG capsule Take 20 mg by mouth daily.    potassium chloride SA (K-DUR,KLOR-CON) 20 MEQ tablet Take 20 mEq by  mouth 2 (two) times daily.    tiotropium (SPIRIVA) 18 MCG inhalation capsule Place 1 capsule into inhaler and inhale daily.    triamterene-hydrochlorothiazide (DYAZIDE) 37.5-25 MG capsule Take 1 capsule by mouth daily.        If you experience worsening of your admission symptoms, develop shortness of breath, life threatening emergency, suicidal or homicidal thoughts you must seek medical attention immediately by calling 911 or calling your MD immediately  if  symptoms less severe.  You Must read complete instructions/literature along with all the possible adverse reactions/side effects for all the Medicines you take and that have been prescribed to you. Take any new Medicines after you have completely understood and accept all the possible adverse reactions/side effects.   Please note  You were cared for by a hospitalist during your hospital stay. If you have any questions about your discharge medications or the care you received while you were in the hospital after you are discharged, you can call the unit and asked to speak with the hospitalist on call if the hospitalist that took care of you is not available. Once you are discharged, your primary care physician will handle any further medical issues. Please note that NO REFILLS for any discharge medications will be authorized once you are discharged, as it is imperative that you return to your primary care physician (or establish a relationship with a primary care physician if you do not have one) for your aftercare needs so that they can reassess your need for medications and monitor your lab values. Today   SUBJECTIVE   Feels a lot better. Getting breathing treatment  VITAL SIGNS:  Blood pressure (!) 154/88, pulse 99, temperature 98 F (36.7 C), temperature source Oral, resp. rate 18, height 5\' 2"  (1.575 m), weight 53.9 kg (118 lb 14.4 oz), SpO2 (!) 87 %.  I/O:   Intake/Output Summary (Last 24 hours) at 08/27/16 1344 Last data filed at 08/27/16 0900  Gross per 24 hour  Intake              240 ml  Output                0 ml  Net              240 ml    PHYSICAL EXAMINATION:  GENERAL:  60 y.o.-year-old patient lying in the bed with no acute distress.  EYES: Pupils equal, round, reactive to light and accommodation. No scleral icterus. Extraocular muscles intact.  HEENT: Head atraumatic, normocephalic. Oropharynx and nasopharynx clear.  NECK:  Supple, no jugular venous distention. No  thyroid enlargement, no tenderness.  LUNGS: distant breath sounds bilaterally, no wheezing, rales,rhonchi or crepitation. No use of accessory muscles of respiration.  CARDIOVASCULAR: S1, S2 normal. No murmurs, rubs, or gallops.  ABDOMEN: Soft, non-tender, non-distended. Bowel sounds present. No organomegaly or mass.  EXTREMITIES: No pedal edema, cyanosis, or clubbing.  NEUROLOGIC: Cranial nerves II through XII are intact. Muscle strength 5/5 in all extremities. Sensation intact. Gait not checked.  PSYCHIATRIC: The patient is alert and oriented x 3.  SKIN: No obvious rash, lesion, or ulcer.   DATA REVIEW:   CBC   Recent Labs Lab 08/27/16 0339  WBC 6.6  HGB 15.1  HCT 43.7  PLT 277    Chemistries   Recent Labs Lab 08/27/16 0339  NA 135  K 3.1*  CL 97*  CO2 31  GLUCOSE 166*  BUN 13  CREATININE 0.83  CALCIUM 9.2  Microbiology Results   No results found for this or any previous visit (from the past 240 hour(s)).  RADIOLOGY:  Dg Chest 2 View  Result Date: 08/26/2016 CLINICAL DATA:  Shortness of breath for 2 weeks. EXAM: CHEST  2 VIEW COMPARISON:  None. FINDINGS: The cardiomediastinal silhouette is unremarkable. COPD/emphysema changes noted. Bibasilar atelectasis/ scarring noted. There is no evidence of focal airspace disease, pulmonary edema, suspicious pulmonary nodule/mass, pleural effusion, or pneumothorax. No acute bony abnormalities are identified. IMPRESSION: COPD/ emphysema with mild bibasilar atelectasis versus scarring. Electronically Signed   By: Harmon Pier M.D.   On: 08/26/2016 17:58     Management plans discussed with the patient, family and they are in agreement.  CODE STATUS:     Code Status Orders        Start     Ordered   08/27/16 0054  Full code  Continuous     08/27/16 0053    Code Status History    Date Active Date Inactive Code Status Order ID Comments User Context   This patient has a current code status but no historical code status.       TOTAL TIME TAKING CARE OF THIS PATIENT: 40 minutes.    Adell Panek M.D on 08/27/2016 at 1:44 PM  Between 7am to 6pm - Pager - [phone] After 6pm go to [email or web address] - password [email or web address] [facility]  [facility]  Office  [phone]  CC: Primary care physician; No primary care provider on file.

## 2016-08-27 NOTE — Care Management (Addendum)
Admitted to [facility] with the diagnosis of COPD.  Primary care physician is Dr. Geraldo Pitteravid Klein, which she has seen in the last 6 weeks. Independent of all basic and instrumental activities of daily living, drives. Uses no aids for ambulation.  Home oxygen and nebulizer needed for home use.  [employer] insurance card faxed to [employer] in insurance. Discussed medical equipment in the home. Agreed to [facility]. Feliberto GottronJason Hinton, [facility] representative updated. Discharge to home today per Dr. Allena KatzPatel. Telephone # [phone]. Address is [address] [place]  Gwenette GreetBrenda S Ramey Ketcherside RN MSN CCM Care Management [phone]

## 2016-08-27 NOTE — Progress Notes (Signed)
Pharmacy Antibiotic Note  Gloria BunkerDeborah Beck is a 60 y.o. female admitted on 08/26/2016 with AECOPD.  Pharmacy has been consulted for levofloxacin dosing.  Plan: Pt received levofloxacin 750 mg IV x 1. Per MD note, pt has anaphylaxis to azithromycin. Will continue patient on levofloxacin 750 mg PO q24 based on renal function. Per nurse, pt is tolerating PO meds and diet.  Height: 5\' 2"  (157.5 cm) Weight: 118 lb 14.4 oz (53.9 kg) IBW/kg (Calculated) : 50.1  Temp (24hrs), Avg:97.8 F (36.6 C), Min:97.6 F (36.4 C), Max:98 F (36.7 C)   Recent Labs Lab 08/26/16 1733 08/27/16 0339  WBC 11.7* 6.6  CREATININE 1.01* 0.83    Estimated Creatinine Clearance: 56.3 mL/min (by C-G formula based on SCr of 0.83 mg/dL).    Allergies  Allergen Reactions  . Biaxin [Clarithromycin] Anaphylaxis  . Citracal [Calcium Citrate] Anaphylaxis  . Penicillins Rash and Other (See Comments)    Syncope Has patient had a PCN reaction causing immediate rash, facial/tongue/throat swelling, SOB or lightheadedness with hypotension: No Has patient had a PCN reaction causing severe rash involving mucus membranes or skin necrosis: No Has patient had a PCN reaction that required hospitalization No Has patient had a PCN reaction occurring within the last 10 years: No If all of the above answers are "NO", then may proceed with Cephalosporin use.   Gloria Beck. Septra [Sulfamethoxazole-Trimethoprim] Anaphylaxis  . Sudafed [Pseudoephedrine Hcl] Anaphylaxis  . Other     Pt reports allergy to one other abx but does not remember what    Antimicrobials this admission: Levofloxacin 10/16  Dose adjustments this admission:   Microbiology results: None ordered  Thank you for allowing pharmacy to be a part of this patient's care.  Gloria LatinoHolly Takai Beck, PharmD Pharmacy Resident 08/27/2016 8:54 AM

## 2016-08-27 NOTE — Progress Notes (Signed)
Discharge instructions given and went over with patient at bedside. Prescriptions given. Patient instructed to follow-up with pcp. All questions answered. Patient discharged home with daughter via wheelchair by nursing staff. Bo McclintockBrewer,Kimyatta Lecy S, RN

## 2017-09-11 ENCOUNTER — Emergency Department

## 2017-09-11 ENCOUNTER — Encounter: Payer: Self-pay | Admitting: *Deleted

## 2017-09-11 ENCOUNTER — Emergency Department
Admission: EM | Admit: 2017-09-11 | Discharge: 2017-09-11 | Disposition: A | Discharge status: Home / Self Care | Attending: Emergency Medicine | Admitting: Emergency Medicine

## 2017-09-11 DIAGNOSIS — I10 Essential (primary) hypertension: Secondary | ICD-10-CM | POA: Insufficient documentation

## 2017-09-11 DIAGNOSIS — R55 Syncope and collapse: Secondary | ICD-10-CM

## 2017-09-11 DIAGNOSIS — J45909 Unspecified asthma, uncomplicated: Secondary | ICD-10-CM | POA: Diagnosis not present

## 2017-09-11 DIAGNOSIS — J449 Chronic obstructive pulmonary disease, unspecified: Secondary | ICD-10-CM | POA: Diagnosis not present

## 2017-09-11 DIAGNOSIS — F17200 Nicotine dependence, unspecified, uncomplicated: Secondary | ICD-10-CM | POA: Insufficient documentation

## 2017-09-11 LAB — CBC WITH DIFFERENTIAL/PLATELET
BASOS ABS: 0.1 10*3/uL (ref 0–0.1)
Basophils Relative: 1 %
EOS ABS: 0.3 10*3/uL (ref 0–0.7)
Eosinophils Relative: 2 %
HCT: 50.9 % — ABNORMAL HIGH (ref 35.0–47.0)
HEMOGLOBIN: 17 g/dL — AB (ref 12.0–16.0)
Lymphocytes Relative: 34 %
Lymphs Abs: 3.8 10*3/uL — ABNORMAL HIGH (ref 1.0–3.6)
MCH: 30.5 pg (ref 26.0–34.0)
MCHC: 33.4 g/dL (ref 32.0–36.0)
MCV: 91.3 fL (ref 80.0–100.0)
Monocytes Absolute: 0.5 10*3/uL (ref 0.2–0.9)
Monocytes Relative: 4 %
NEUTROS PCT: 59 %
Neutro Abs: 6.6 10*3/uL — ABNORMAL HIGH (ref 1.4–6.5)
PLATELETS: 291 10*3/uL (ref 150–440)
RBC: 5.57 MIL/uL — AB (ref 3.80–5.20)
RDW: 13.3 % (ref 11.5–14.5)
WBC: 11.2 10*3/uL — AB (ref 3.6–11.0)

## 2017-09-11 LAB — COMPREHENSIVE METABOLIC PANEL
ALBUMIN: 3.1 g/dL — AB (ref 3.5–5.0)
ALK PHOS: 49 U/L (ref 38–126)
ALT: 9 U/L — AB (ref 14–54)
AST: 14 U/L — AB (ref 15–41)
Anion gap: 6 (ref 5–15)
BUN: 10 mg/dL (ref 6–20)
CALCIUM: 7.7 mg/dL — AB (ref 8.9–10.3)
CHLORIDE: 103 mmol/L (ref 101–111)
CO2: 30 mmol/L (ref 22–32)
CREATININE: 0.85 mg/dL (ref 0.44–1.00)
GFR calc non Af Amer: >60 mL/min (ref >60)
GLUCOSE: 133 mg/dL — AB (ref 65–99)
Potassium: 3.4 mmol/L — ABNORMAL LOW (ref 3.5–5.1)
SODIUM: 139 mmol/L (ref 135–145)
Total Bilirubin: 0.2 mg/dL — ABNORMAL LOW (ref 0.3–1.2)
Total Protein: 5.1 g/dL — ABNORMAL LOW (ref 6.5–8.1)

## 2017-09-11 LAB — TROPONIN I: Troponin I: <0.03 ng/mL (ref <0.03)

## 2017-09-11 LAB — URINALYSIS, COMPLETE (UACMP) WITH MICROSCOPIC
BACTERIA UA: NONE SEEN
BILIRUBIN URINE: NEGATIVE
Glucose, UA: NEGATIVE mg/dL
Hgb urine dipstick: NEGATIVE
KETONES UR: NEGATIVE mg/dL
Leukocytes, UA: NEGATIVE
Nitrite: NEGATIVE
Protein, ur: NEGATIVE mg/dL
Specific Gravity, Urine: 1.01 (ref 1.005–1.030)
pH: 7 (ref 5.0–8.0)

## 2017-09-11 MED ORDER — SODIUM CHLORIDE 0.9 % IV SOLN
1000.0000 mL | Freq: Once | INTRAVENOUS | Status: AC
  Administered 2017-09-11: 1000 mL via INTRAVENOUS

## 2017-09-11 NOTE — ED Notes (Signed)
Pt alert and oriented X4, active, cooperative, pt in NAD. RR even and unlabored, color WNL.  Pt informed to return if any life threatening symptoms occur.  Discharge and followup instructions reviewed.  

## 2017-09-11 NOTE — ED Triage Notes (Signed)
Per EMS report, patient was at the [facility] and was nearly finished donating when she had a syncopal episode. Per EMS report, staff reports patient woke up when staff were doing chest compression. Staff report they did approximately 10 compression. Patient was alert and oriented and ambulatory upon EMS arrival. Patient was incontinent of urine during episode.

## 2017-09-11 NOTE — ED Provider Notes (Signed)
[facility] Emergency Department Provider Note       Time seen: ----------------------------------------- 1:25 PM on 09/11/2017 -----------------------------------------     I have reviewed the triage vital signs and the nursing notes.   HISTORY   Chief Complaint Loss of Consciousness    HPI Gloria Beck is a 61 y.o. female with a history of hypertension and COPD who presents to the ED for syncope.  Patient was donating plasma today when she had a syncopal event.  According to EMS, staff reported she woke up when the staff was doing chest compressions on her.  Reportedly they did around 10 chest compressions.  She has never had a syncopal episode before, this was only her second time donating plasma.  She has not been sick but thinks she may have gotten dehydrated.  Past Medical History:  Diagnosis Date  . Asthma   . Hypertension   . TIA (transient ischemic attack)     Patient Active Problem List   Diagnosis Date Noted  . COPD exacerbation (HCC) 08/26/2016    Past Surgical History:  Procedure Laterality Date  . ABDOMINAL HYSTERECTOMY      Allergies Biaxin [clarithromycin]; Citracal [calcium citrate]; Dye fdc red [red dye]; Penicillins; Septra [sulfamethoxazole-trimethoprim]; Sudafed [pseudoephedrine hcl]; and Other  Social History Social History  Substance Use Topics  . Smoking status: Current Every Day Smoker  . Smokeless tobacco: Never Used  . Alcohol use No    Review of Systems Constitutional: Negative for fever. Cardiovascular: Negative for chest pain. Respiratory: Negative for shortness of breath. Gastrointestinal: Negative for abdominal pain, vomiting and diarrhea. Genitourinary: Negative for dysuria. Musculoskeletal: Negative for back pain. Skin: Negative for rash. Neurological: Negative for headaches, focal weakness or numbness.  All systems negative/normal/unremarkable except as stated in the  HPI  ____________________________________________   PHYSICAL EXAM:  VITAL SIGNS: ED Triage Vitals [09/11/17 1323]  Enc Vitals Group     BP      Pulse Rate 81     Resp 18     Temp      Temp src      SpO2      Weight      Height      Head Circumference      Peak Flow      Pain Score 0     Pain Loc      Pain Edu?      Excl. in GC?     Constitutional: Alert and oriented. Well appearing and in no distress. Eyes: Conjunctivae are normal. Normal extraocular movements. ENT   Head: Normocephalic and atraumatic.   Nose: No congestion/rhinnorhea.   Mouth/Throat: Mucous membranes are moist.   Neck: No stridor. Cardiovascular: Normal rate, regular rhythm. No murmurs, rubs, or gallops. Respiratory: Normal respiratory effort without tachypnea nor retractions. Breath sounds are clear and equal bilaterally. No wheezes/rales/rhonchi. Gastrointestinal: Soft and nontender. Normal bowel sounds Musculoskeletal: Nontender with normal range of motion in extremities. No lower extremity tenderness nor edema. Neurologic:  Normal speech and language. No gross focal neurologic deficits are appreciated.  Skin:  Skin is warm, dry and intact. No rash noted. Psychiatric: Mood and affect are normal. Speech and behavior are normal.  ____________________________________________  EKG: Interpreted by me.  Sinus rhythm rate 77 bpm, normal PR interval, normal QRS, normal QT.  ____________________________________________  ED COURSE:  Pertinent labs & imaging results that were available during my care of the patient were reviewed by me and considered in my medical decision making (see chart  for details). Patient presents for syncope, we will assess with labs and imaging as indicated.   Procedures ____________________________________________   LABS (pertinent positives/negatives)  Labs Reviewed  CBC WITH DIFFERENTIAL/PLATELET - Abnormal; Notable for the following:       Result Value   WBC  11.2 (*)    RBC 5.57 (*)    Hemoglobin 17.0 (*)    HCT 50.9 (*)    Neutro Abs 6.6 (*)    Lymphs Abs 3.8 (*)    All other components within normal limits  COMPREHENSIVE METABOLIC PANEL - Abnormal; Notable for the following:    Potassium 3.4 (*)    Glucose, Bld 133 (*)    Calcium 7.7 (*)    Total Protein 5.1 (*)    Albumin 3.1 (*)    AST 14 (*)    ALT 9 (*)    Total Bilirubin 0.2 (*)    All other components within normal limits  URINALYSIS, COMPLETE (UACMP) WITH MICROSCOPIC - Abnormal; Notable for the following:    Color, Urine YELLOW (*)    APPearance CLEAR (*)    Squamous Epithelial / LPF 0-5 (*)    All other components within normal limits  TROPONIN I  CBG MONITORING, ED    RADIOLOGY Images were viewed by me  Chest x-ray IMPRESSION: No active disease. Chronic pulmonary hyperinflation. Chronic scarring at the bases. ____________________________________________  DIFFERENTIAL DIAGNOSIS   Dehydration, vasovagal syncope, anemia, MI, arrhythmia   FINAL ASSESSMENT AND PLAN  Syncope  Plan: Patient had presented for syncope after donating plasma. Patients labs do indicate some degree of dehydration. Patients imaging was negative.  Patient states she currently feels well and wants to go home.  She is cleared for close outpatient follow-up.   Emily FilbertWilliams, Trafton Roker E, MD   Note: This note was generated in part or whole with voice recognition software. Voice recognition is usually quite accurate but there are transcription errors that can and very often do occur. I apologize for any typographical errors that were not detected and corrected.     Emily FilbertWilliams, Caedan Sumler E, MD 09/11/17 [phone]

## 2018-01-23 IMAGING — CR DG CHEST 2V
1 series · 2 of 2 positions shown · non-contrast
Comparison: None.

CLINICAL DATA: Shortness of breath for 2 weeks.

EXAM:
CHEST  2 VIEW

[Series 1: dg chest 2 view · 0.14mm/px · 2 of 2 slices shown]
[im 1/2]
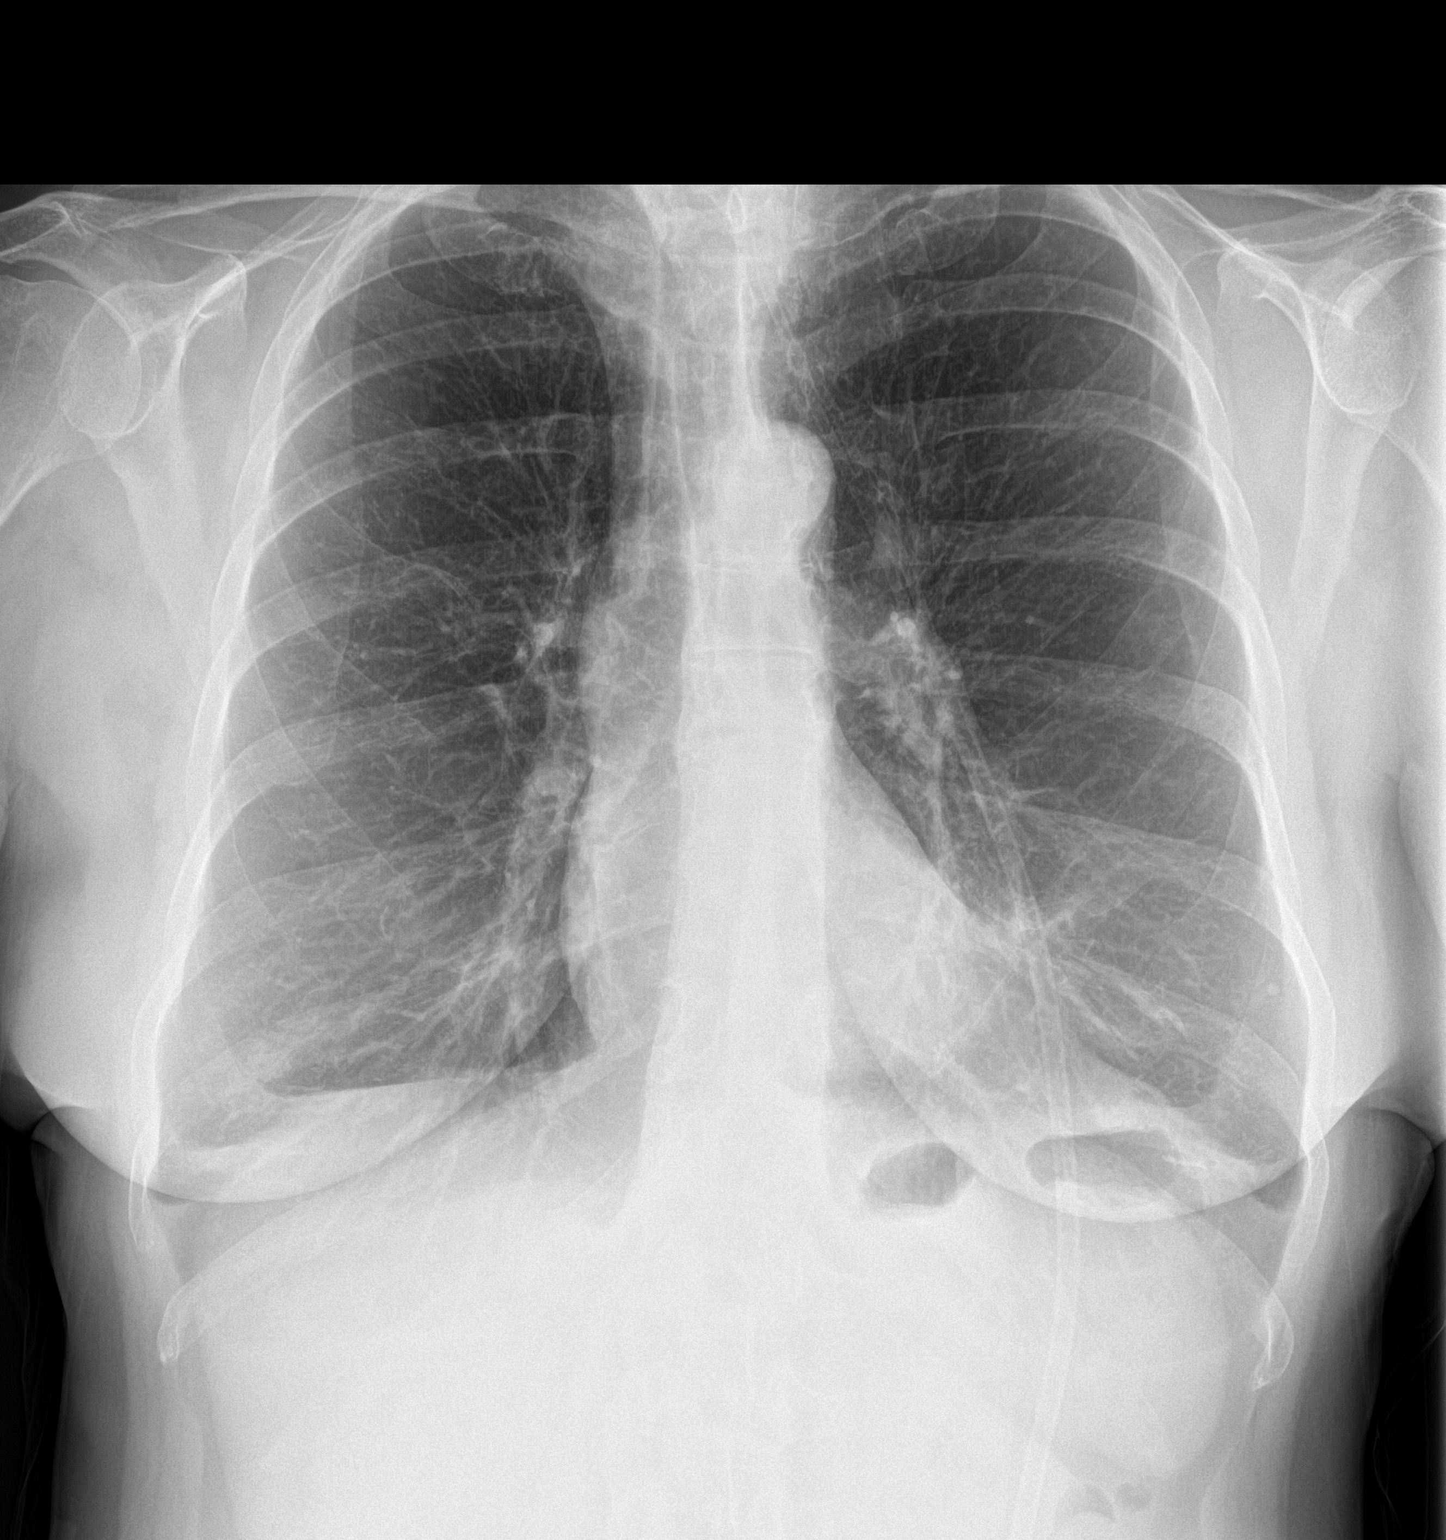
[im 2/2]
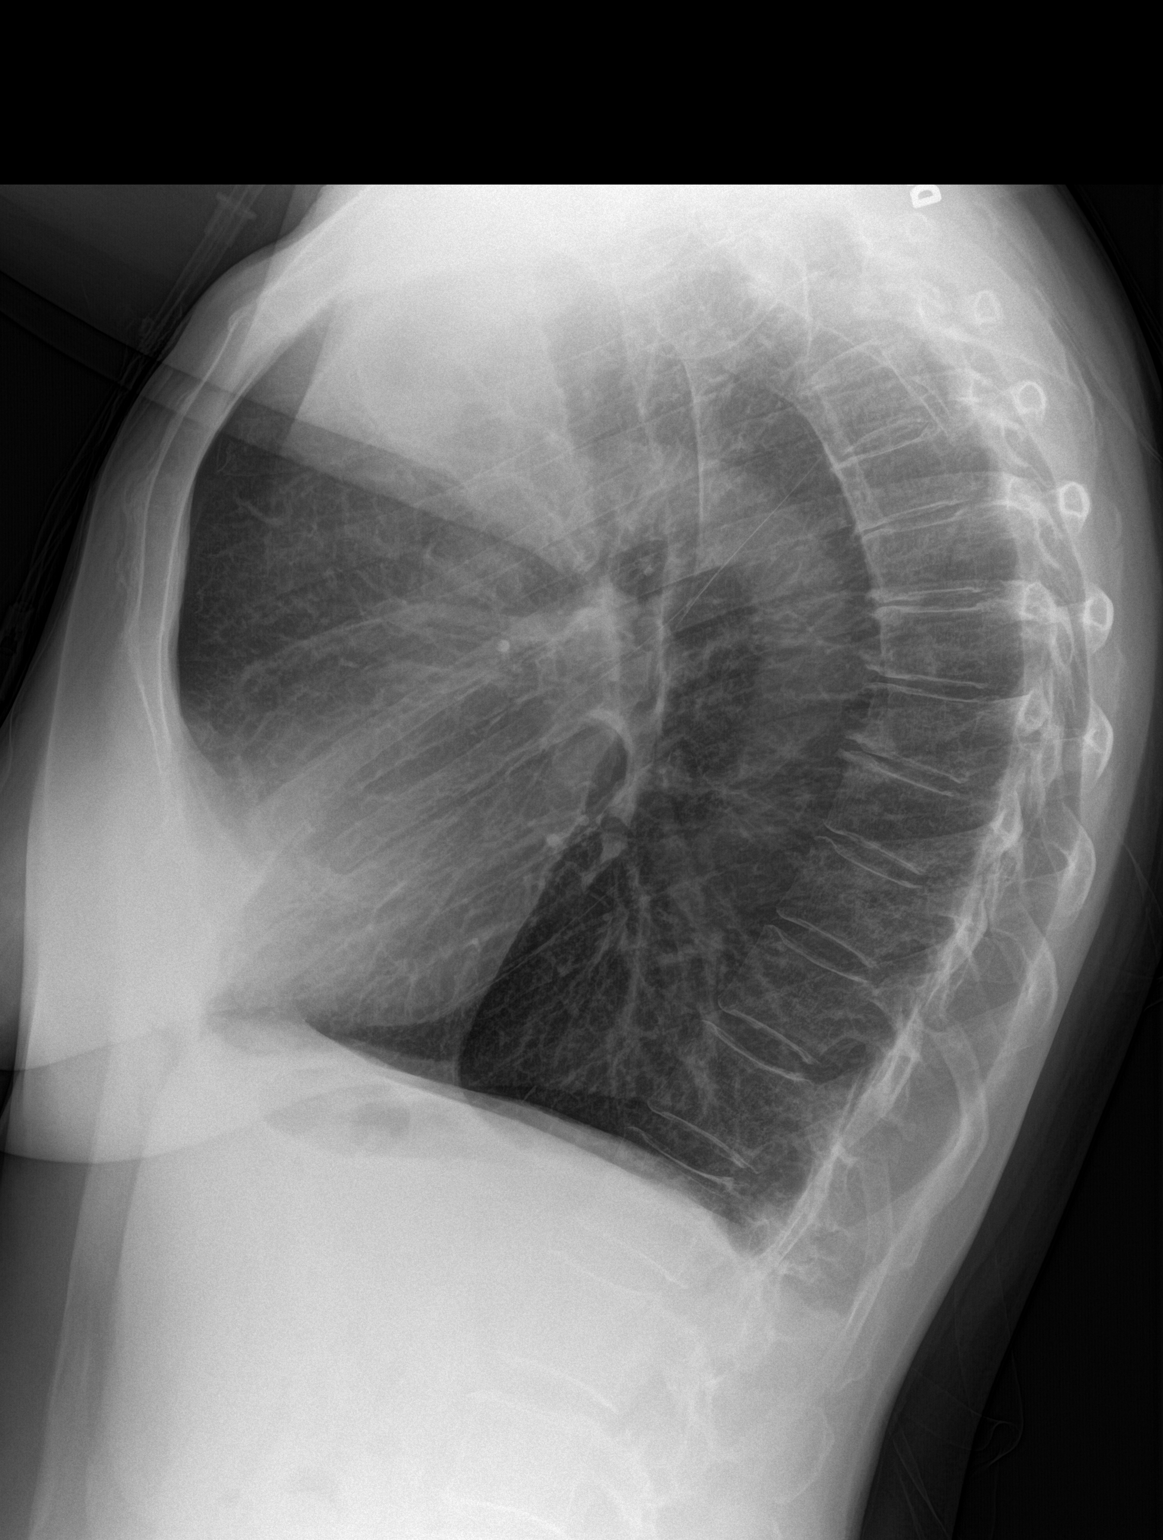

[2 of 2 positions shown; findings below may reference images not displayed]

FINDINGS: The cardiomediastinal silhouette is unremarkable.

COPD/emphysema changes noted.

Bibasilar atelectasis/ scarring noted.

There is no evidence of focal airspace disease, pulmonary edema,
suspicious pulmonary nodule/mass, pleural effusion, or pneumothorax.
No acute bony abnormalities are identified.
IMPRESSION: COPD/ emphysema with mild bibasilar atelectasis versus scarring.

## 2019-02-08 IMAGING — DX DG CHEST 1V
1 series · 1 of 1 positions shown · non-contrast
Comparison: 08/26/2016

CLINICAL DATA: Syncopal episode.

EXAM:
CHEST 1 VIEW

[chest ap]
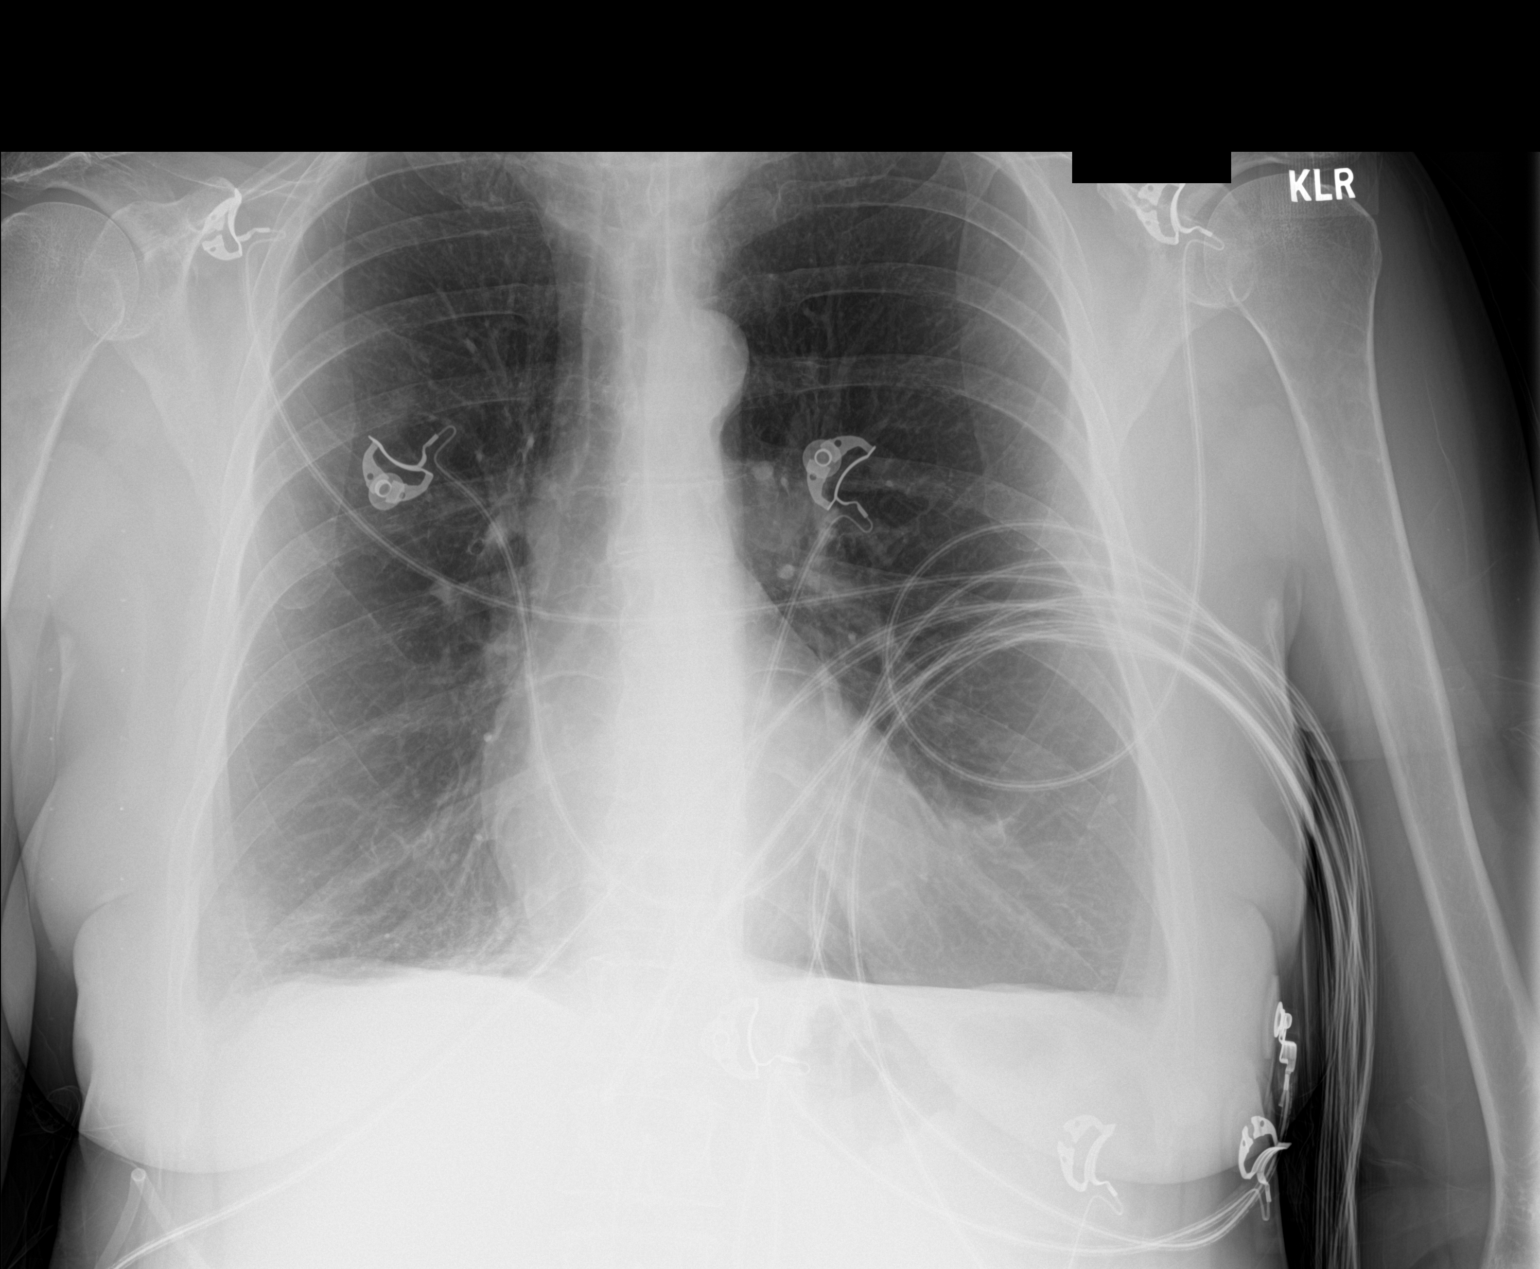

[1 of 1 positions shown; findings below may reference images not displayed]

FINDINGS: Artifact overlies chest. Heart size is normal. Mediastinal shadows
are normal. Chronic pulmonary hyperinflation. Chronic pleural
scarring at the bases. No evidence of pneumothorax. No visible
fracture.
IMPRESSION: No active disease. Chronic pulmonary hyperinflation. Chronic
scarring at the bases.

## 2020-11-29 ENCOUNTER — Emergency Department (HOSPITAL_COMMUNITY): Admission: EM | Admit: 2020-11-29 | Payer: Medicare Other | Source: Home / Self Care

## 2020-11-30 NOTE — ED Provider Notes (Signed)
Patient not seen in the ED.    Maia Plan, MD 11/30/20 [phone]
